# Patient Record
Sex: Male | Born: 2000 | Race: White | Hispanic: No | Marital: Single | Smoking: Never smoker
Health system: Southern US, Community
[De-identification: ages and names within clinical notes are randomized; demographics above are authoritative.]

---
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
## 2019-03-20 ENCOUNTER — Other Ambulatory Visit (INDEPENDENT_AMBULATORY_CARE_PROVIDER_SITE_OTHER): Payer: Self-pay | Admitting: "Endocrinology

## 2019-03-20 ENCOUNTER — Encounter (INDEPENDENT_AMBULATORY_CARE_PROVIDER_SITE_OTHER): Payer: Self-pay

## 2019-03-20 DIAGNOSIS — E063 Autoimmune thyroiditis: Secondary | ICD-10-CM

## 2019-03-20 NOTE — Telephone Encounter (Signed)
Sent patient a MyChart message informing patient an appointment would be necessary before we could refill any further medications.

## 2019-04-19 IMAGING — DX DG ABDOMEN 2V
2 series · 2 of 2 positions shown · non-contrast
Comparison: None.

CLINICAL DATA: Right lower quadrant pain for several days

EXAM:
ABDOMEN - 2 VIEW

[abdomen erect]
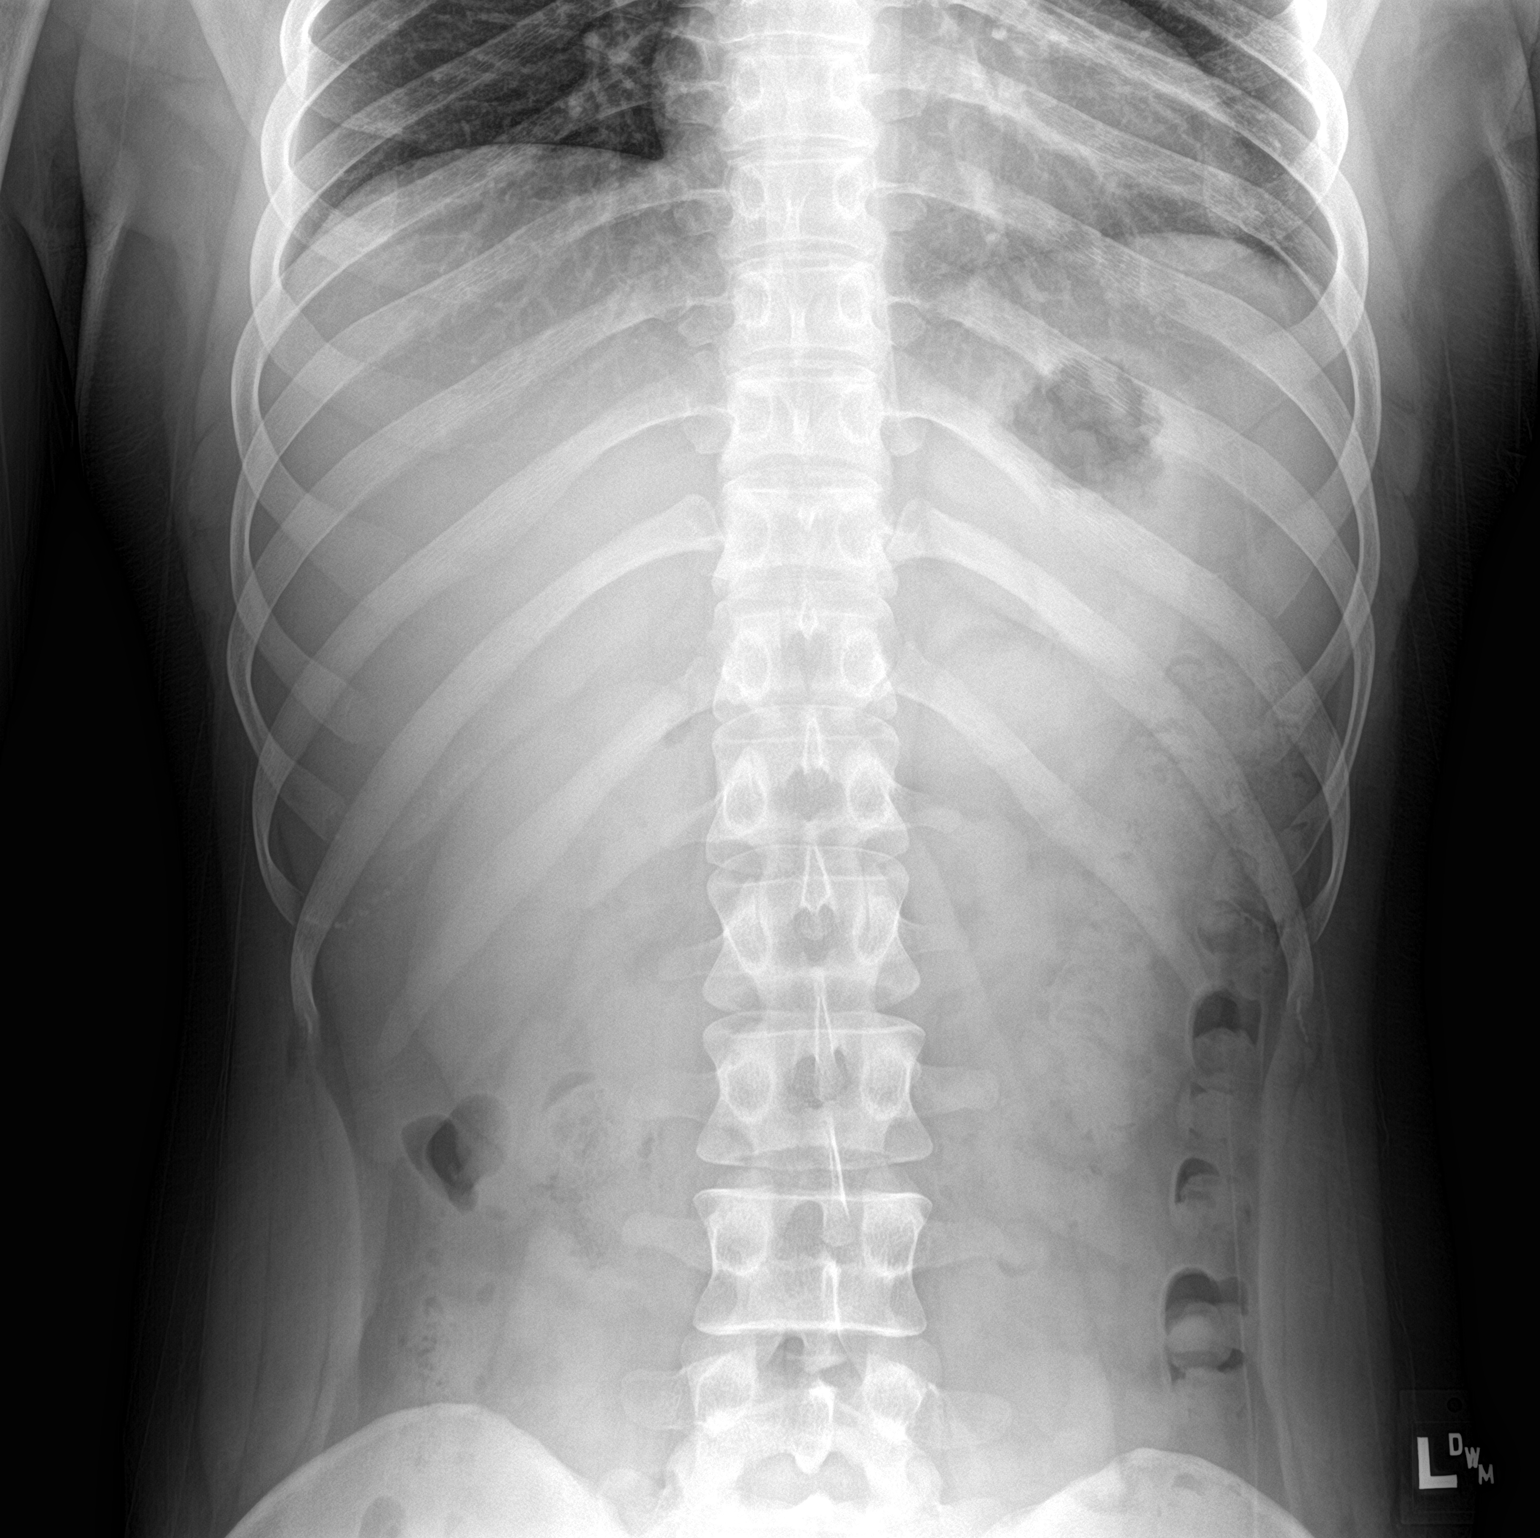

[abdomen supine]
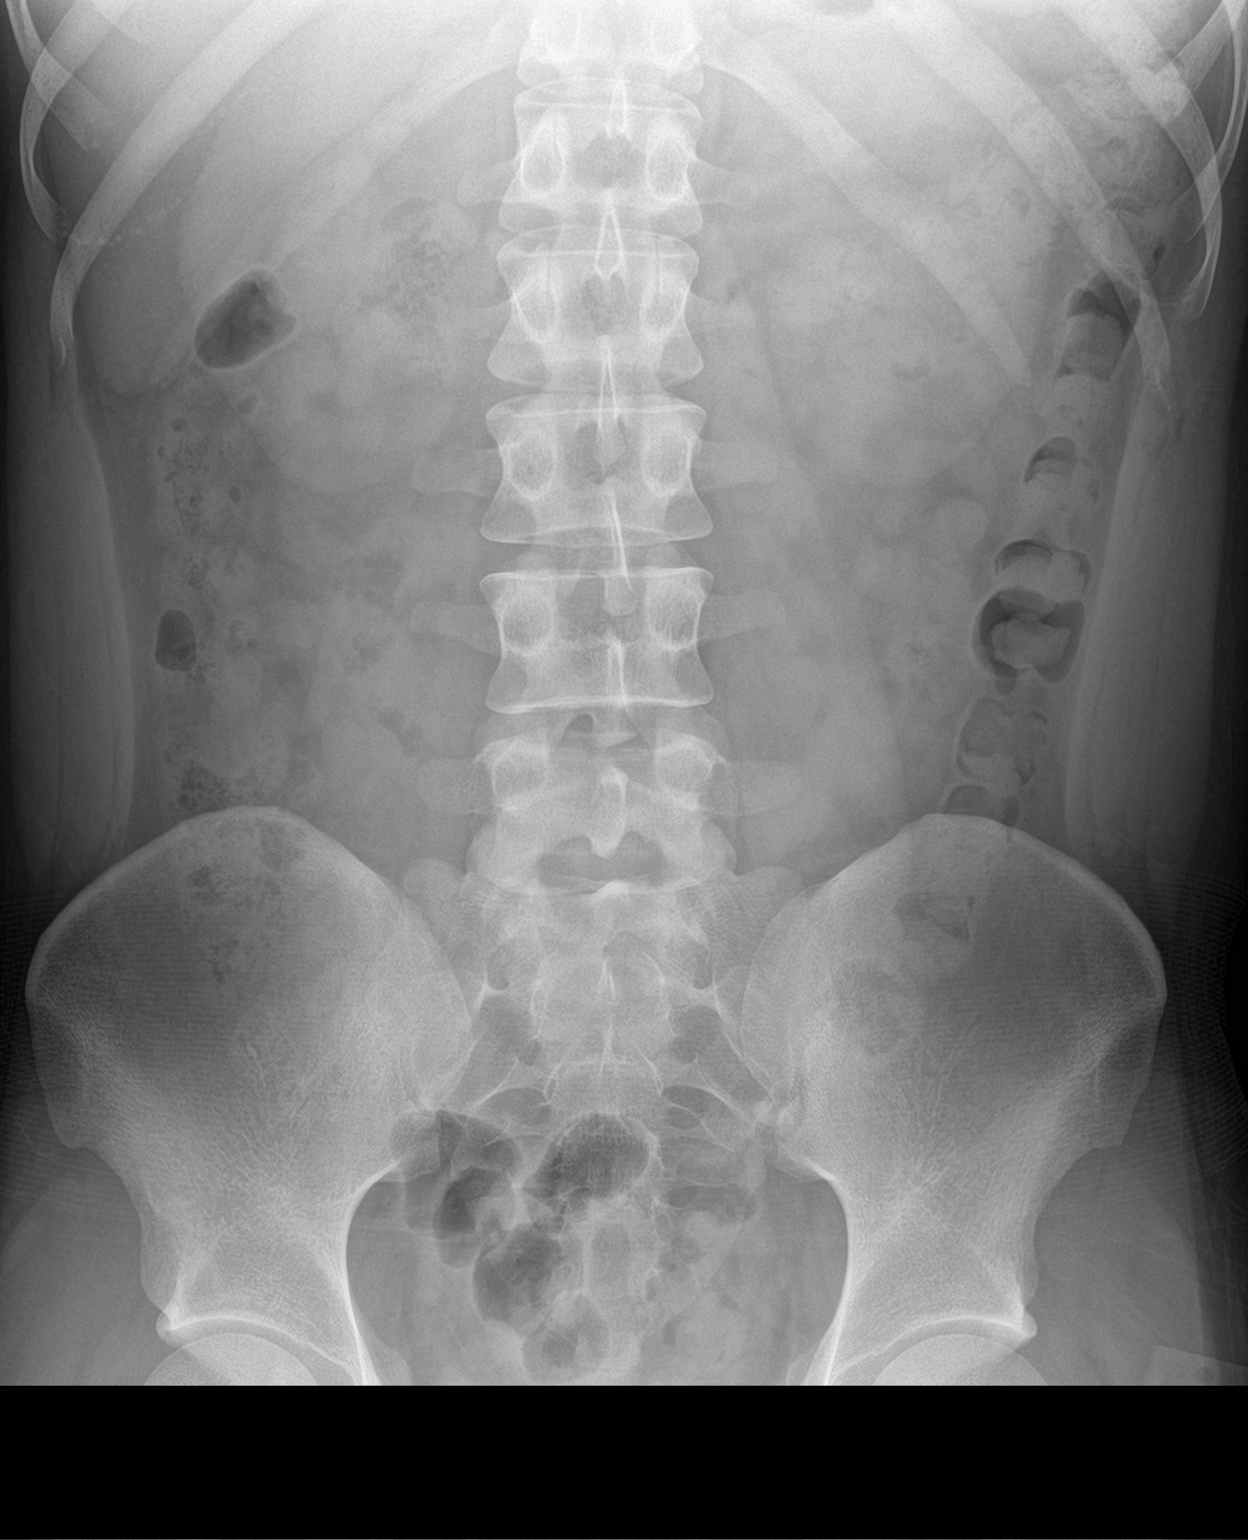

[2 of 2 positions shown; findings below may reference images not displayed]

FINDINGS: Scattered large and small bowel gas is noted. Mild retained fecal
material is noted without obstructive change. This could represent
some mild constipation. No free air is noted. No bony abnormality is
seen.
IMPRESSION: Question mild constipation.

## 2021-10-16 ENCOUNTER — Encounter (INDEPENDENT_AMBULATORY_CARE_PROVIDER_SITE_OTHER): Payer: Self-pay
